# Patient Record
Sex: Male | Born: 1980 | Race: Black or African American | Hispanic: No | Marital: Married | Smoking: Former smoker
Health system: Southern US, Community
[De-identification: ages and names within clinical notes are randomized; demographics above are authoritative.]

## PROBLEM LIST (undated history)

## (undated) DIAGNOSIS — F419 Anxiety disorder, unspecified: Secondary | ICD-10-CM

## (undated) DIAGNOSIS — S39012A Strain of muscle, fascia and tendon of lower back, initial encounter: Secondary | ICD-10-CM

## (undated) DIAGNOSIS — J45909 Unspecified asthma, uncomplicated: Secondary | ICD-10-CM

## (undated) HISTORY — DX: Unspecified asthma, uncomplicated: J45.909

## (undated) HISTORY — PX: OTHER SURGICAL HISTORY: SHX169

## (undated) HISTORY — DX: Anxiety disorder, unspecified: F41.9

## (undated) HISTORY — DX: Strain of muscle, fascia and tendon of lower back, initial encounter: S39.012A

---
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
## 2003-05-11 DIAGNOSIS — S39012A Strain of muscle, fascia and tendon of lower back, initial encounter: Secondary | ICD-10-CM

## 2003-05-11 HISTORY — DX: Strain of muscle, fascia and tendon of lower back, initial encounter: S39.012A

## 2010-04-10 ENCOUNTER — Emergency Department (HOSPITAL_COMMUNITY)
Admission: EM | Admit: 2010-04-10 | Discharge: 2010-04-10 | Payer: Self-pay | Source: Home / Self Care | Admitting: Emergency Medicine

## 2011-05-07 IMAGING — CR DG CHEST 2V
2 series · 2 of 2 positions shown · non-contrast
Comparison: None

CLINICAL DATA: Dyspnea.  Chest pain.

CHEST - 2 VIEW

[w chest pa]
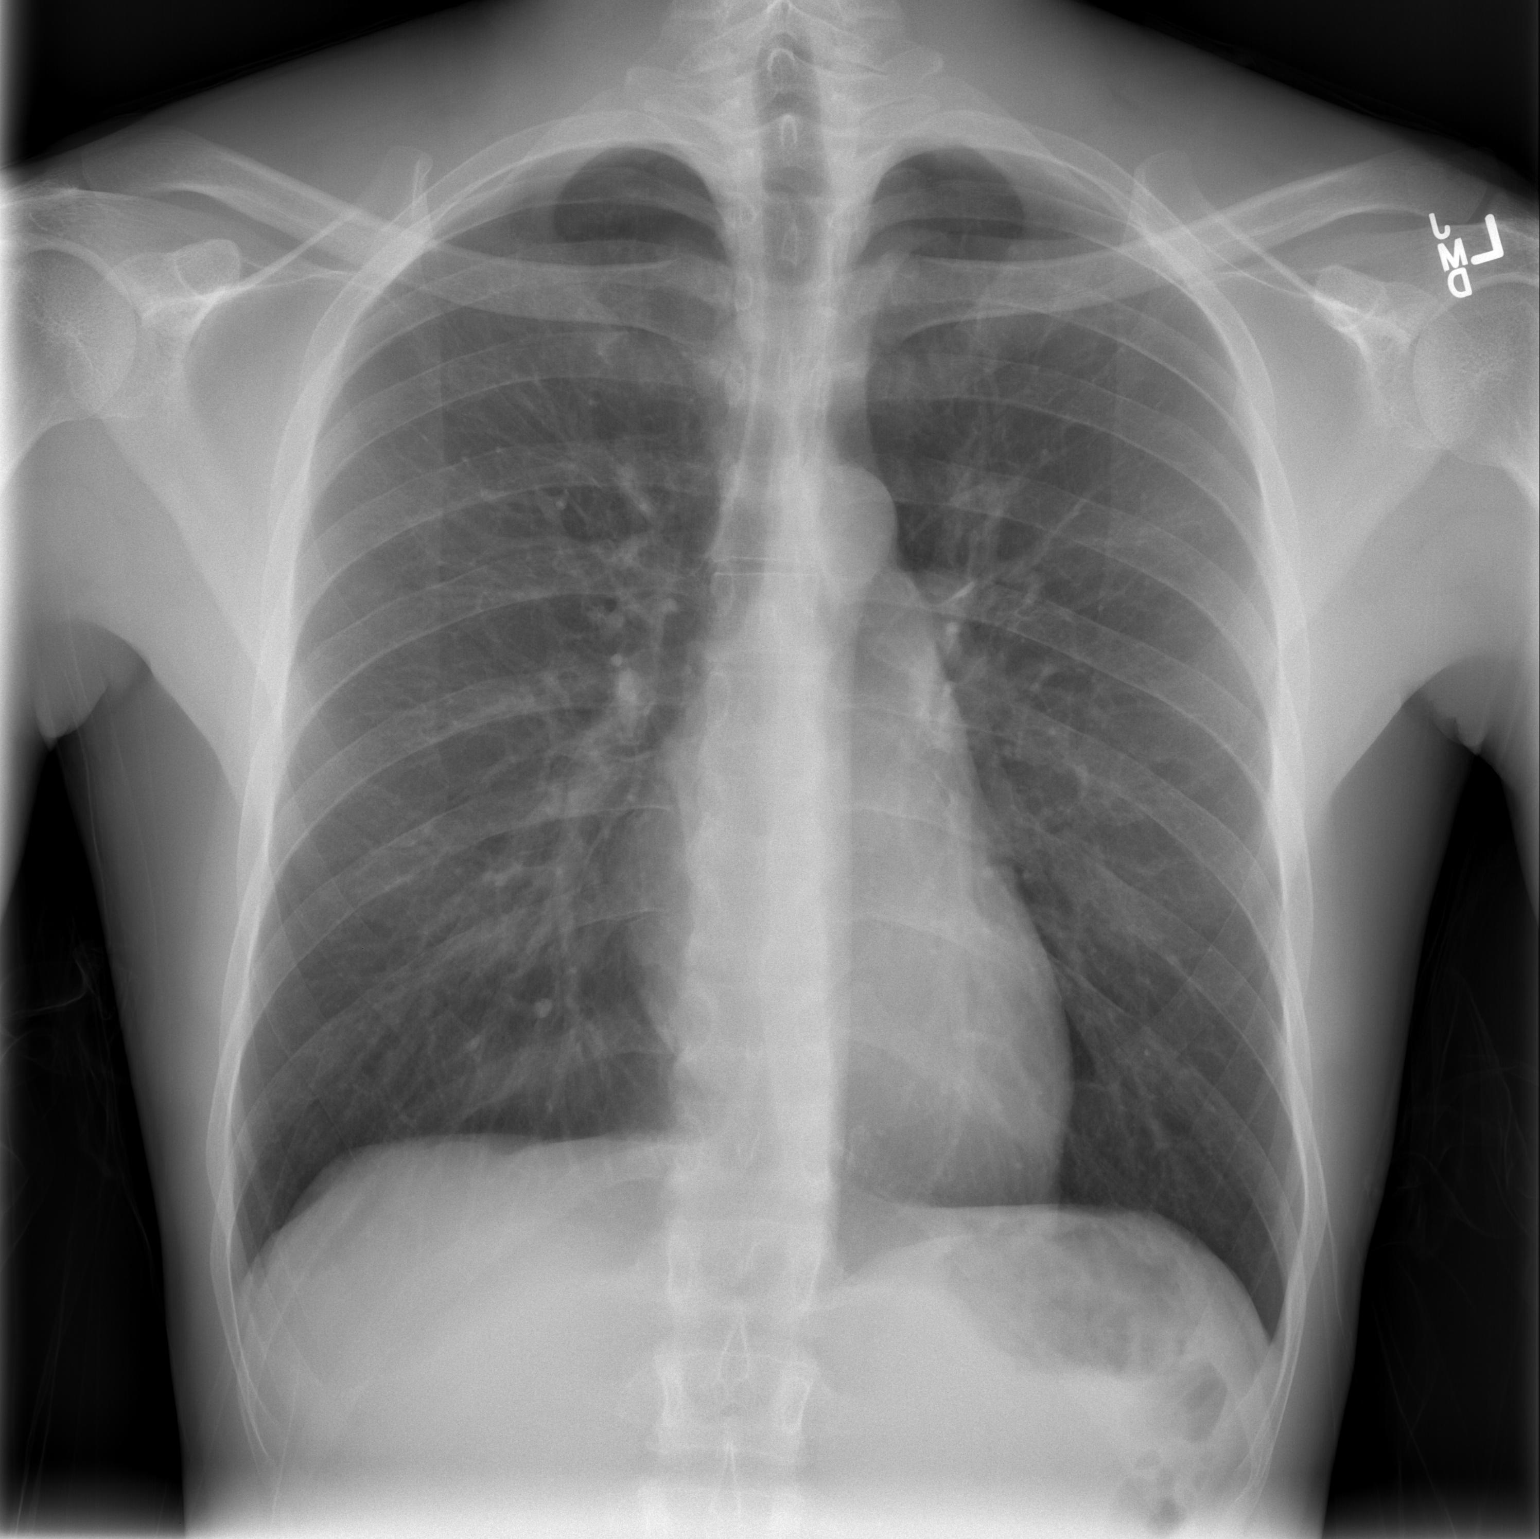

[w chest lat]
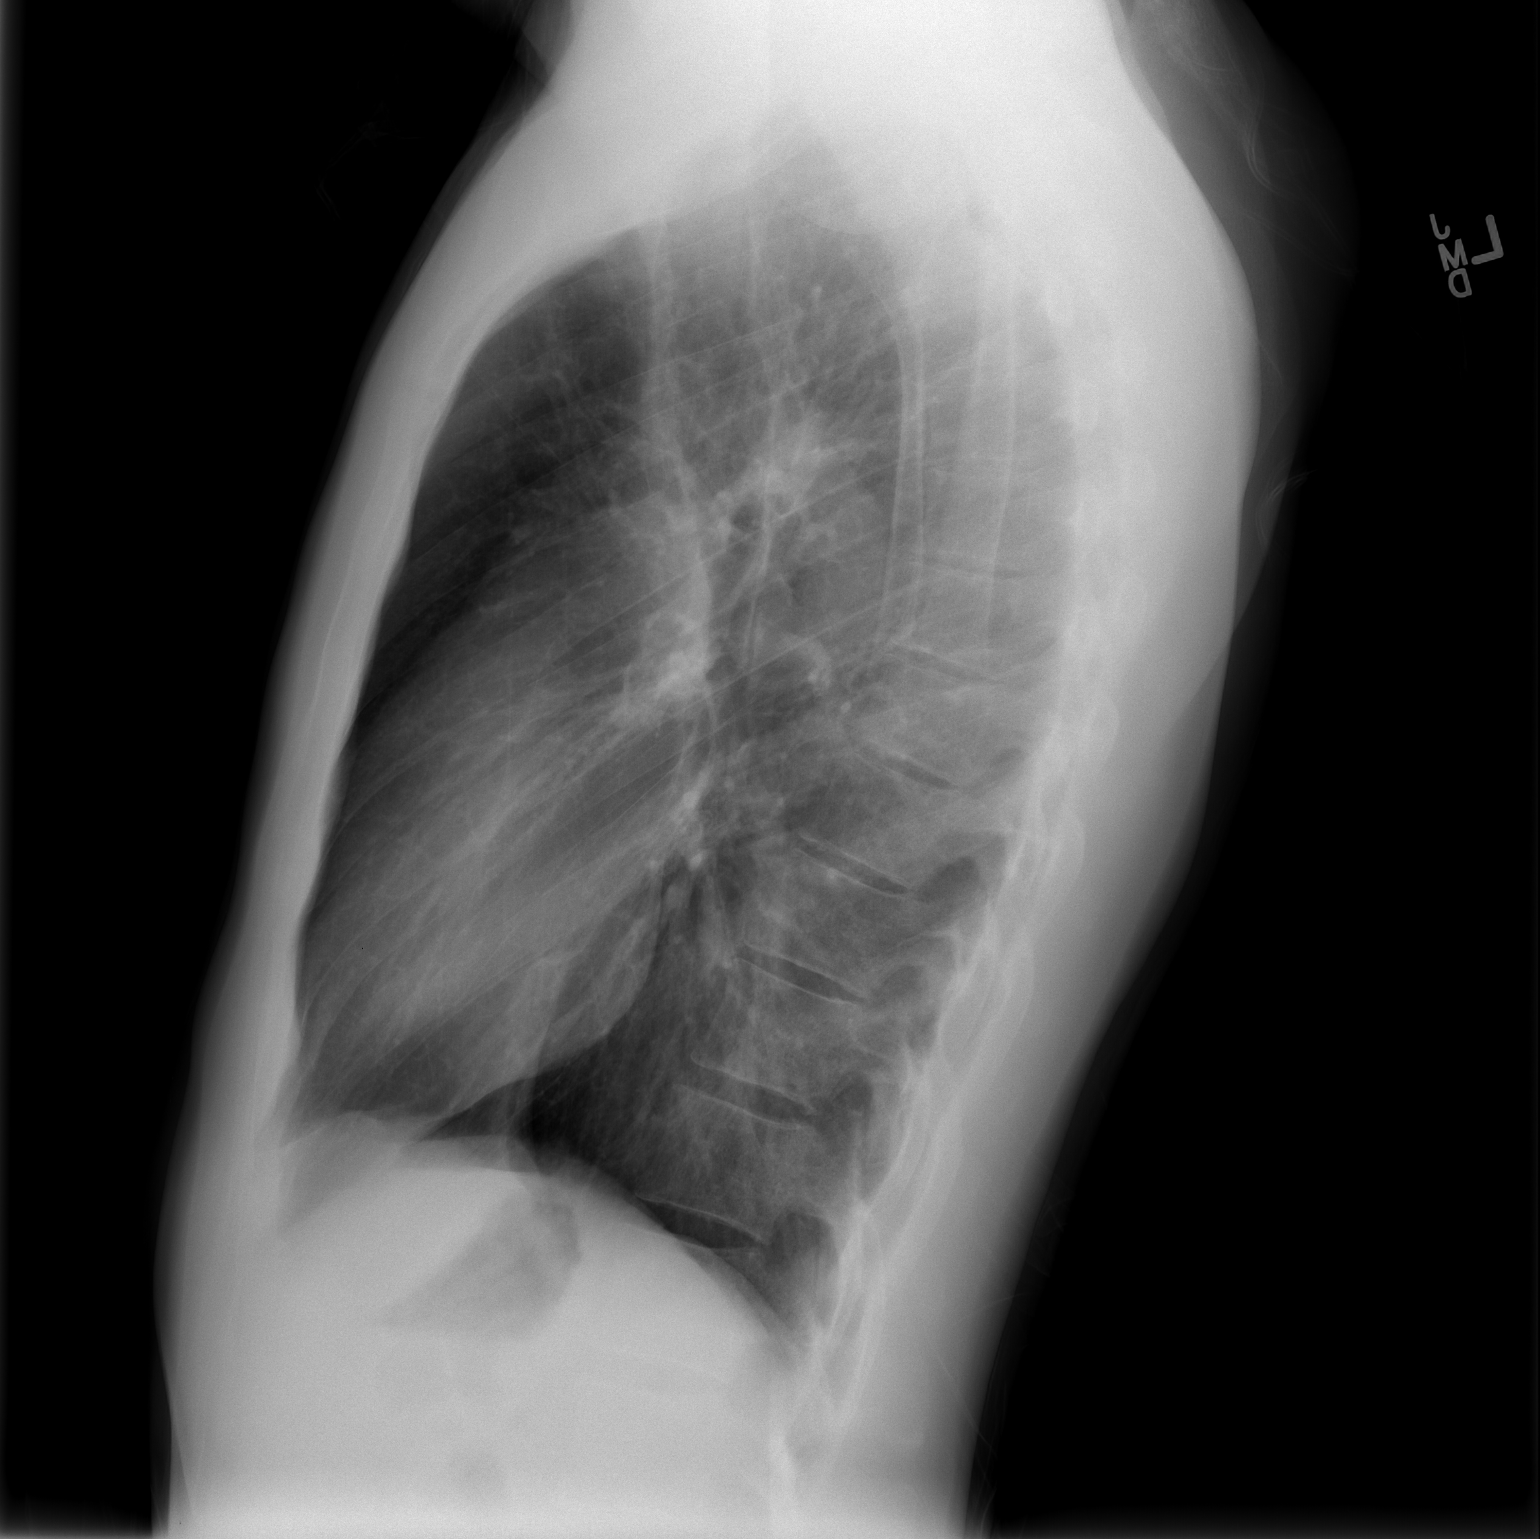

[2 of 2 positions shown; findings below may reference images not displayed]

FINDINGS: Cardiomediastinal silhouette is within normal limits.
The lungs are free of focal consolidations and pleural effusions.
Bony structures have a normal appearance.
IMPRESSION: Negative exam.

## 2013-03-26 ENCOUNTER — Ambulatory Visit: Payer: BC Managed Care – PPO

## 2013-03-27 ENCOUNTER — Emergency Department (HOSPITAL_COMMUNITY)
Admission: EM | Admit: 2013-03-27 | Discharge: 2013-03-27 | Disposition: A | Discharge status: Home / Self Care | Payer: BC Managed Care – PPO | Attending: Emergency Medicine | Admitting: Emergency Medicine

## 2013-03-27 DIAGNOSIS — M545 Low back pain, unspecified: Secondary | ICD-10-CM | POA: Insufficient documentation

## 2013-03-27 DIAGNOSIS — G8911 Acute pain due to trauma: Secondary | ICD-10-CM | POA: Insufficient documentation

## 2013-03-27 DIAGNOSIS — M79609 Pain in unspecified limb: Secondary | ICD-10-CM | POA: Insufficient documentation

## 2013-03-27 MED ORDER — IBUPROFEN 800 MG PO TABS
800.0000 mg | ORAL_TABLET | Freq: Once | ORAL | Status: AC
  Administered 2013-03-27: 800 mg via ORAL
  Filled 2013-03-27: qty 1

## 2013-03-27 MED ORDER — PREDNISONE 20 MG PO TABS
60.0000 mg | ORAL_TABLET | Freq: Once | ORAL | Status: AC
  Administered 2013-03-27: 60 mg via ORAL
  Filled 2013-03-27: qty 3

## 2013-03-27 MED ORDER — IBUPROFEN 800 MG PO TABS: 800.0000 mg | ORAL_TABLET | Freq: Three times a day (TID) | ORAL | Status: DC

## 2013-03-27 MED ORDER — PREDNISONE 10 MG PO TABS: 60.0000 mg | ORAL_TABLET | Freq: Every day | ORAL | Status: DC

## 2013-03-27 NOTE — ED Provider Notes (Signed)
CSN: [id number]     Arrival date & time 03/27/13  4098 History   First MD Initiated Contact with Patient 03/27/13 [phone]     No chief complaint on file.  (Consider location/radiation/quality/duration/timing/severity/associated sxs/prior Treatment) HPI Comments: The patient is a 32 year old male with history of trama presents to the ED with lower back pain and occasional pain in his right leg. He reports after the initial injury, 2 years ago, he had a full evaluation including imaging.  He reports several episodes of increased pain with movement over the past two years.  Most recent episode occurred 2 days ago.  He denies fever or chills, numbness, bowl incontinence, saddle paraesthesia, headache, or urinary retention. No testicular pain, swelling, dysurai or penile discharge.  No PCP at this time.  Patient is a 32 y.o. male presenting with back pain. The history is provided by the patient.  Back Pain Location:  Lumbar spine Quality:  Stiffness Stiffness is present:  All day Radiates to:  R thigh Pain severity:  Moderate Onset quality:  Gradual Duration:  2 days Timing:  Intermittent Progression:  Unchanged Chronicity:  Chronic Context: lifting heavy objects and physical stress   Context: not jumping from heights   Relieved by:  Being still Worsened by:  Bending and movement Ineffective treatments:  NSAIDs Associated symptoms: leg pain   Associated symptoms: no abdominal pain, no bladder incontinence, no bowel incontinence, no dysuria, no fever, no headaches, no numbness, no paresthesias, no perianal numbness, no tingling and no weakness   Risk factors comment:  History of trauma 2 years ago   No past medical history on file. No past surgical history on file. No family history on file. History  Substance Use Topics  . Smoking status: Not on file  . Smokeless tobacco: Not on file  . Alcohol Use: Not on file    Review of Systems  Constitutional: Negative for fever.    Gastrointestinal: Negative for abdominal pain and bowel incontinence.  Genitourinary: Negative for bladder incontinence and dysuria.  Musculoskeletal: Positive for back pain.  Neurological: Negative for tingling, weakness, numbness, headaches and paresthesias.  All other systems reviewed and are negative.    Allergies  Review of patient's allergies indicates not on file.  Home Medications  No current outpatient prescriptions on file. There were no vitals taken for this visit. Physical Exam  Nursing note and vitals reviewed. Constitutional: He is oriented to person, place, and time. He appears well-developed and well-nourished. No distress.  HENT:  Head: Normocephalic and atraumatic.  Eyes: EOM are normal. Pupils are equal, round, and reactive to light. No scleral icterus.  Neck: Neck supple.  Cardiovascular: Normal rate, regular rhythm and normal heart sounds.   No murmur heard. Pulmonary/Chest: Effort normal and breath sounds normal. He has no wheezes.  Abdominal: Soft. Normal appearance and bowel sounds are normal. There is no tenderness. There is no rebound and no guarding.  Musculoskeletal: Normal range of motion.       Lumbar back: He exhibits tenderness. He exhibits normal range of motion, no edema, no deformity, no pain and no spasm.       Back:  SI joints tender to palpation. Right greater than Left.  Neurological: He is alert and oriented to person, place, and time.  Skin: Skin is warm and dry. No rash noted.  Psychiatric: He has a normal mood and affect.    ED Course  Procedures (including critical care time) Labs Review Labs Reviewed - No data to  display Imaging Review No results found.  EKG Interpretation   None       MDM   1. 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 Discussed lab results, imaging results, and treatment plan with the patient.  She reports understanding and  no other concerns at this time.  He has pain at the [facility] joint R>L. Will start him on pain medications and steroids to reduce inflammation.  Patient is stable for discharge at this time.  Meds given in ED:  Medications  ibuprofen (ADVIL,MOTRIN) tablet 800 mg (800 mg Oral Given 03/27/13 1003)  predniSONE (DELTASONE) tablet 60 mg (60 mg Oral Given 03/27/13 1004)    Discharge Medication List as of 03/27/2013  8:56 AM    START taking these medications   Details  ibuprofen (ADVIL,MOTRIN) 800 MG tablet Take 1 tablet (800 mg total) by mouth 3 (three) times daily., Starting 03/27/2013, Until Discontinued, Print    predniSONE (DELTASONE) 10 MG tablet Take 6 tablets (60 mg total) by mouth daily., Starting 03/27/2013, Until Discontinued, Print           Clabe Seal, PA-C 03/29/13 2104

## 2013-03-27 NOTE — Progress Notes (Signed)
[facility] provided pt with a list of primary care resources. Patient stated was in the process of getting back insurance.

## 2013-03-30 NOTE — ED Provider Notes (Signed)
Medical screening examination/treatment/procedure(s) were performed by non-physician practitioner and as supervising physician I was immediately available for consultation/collaboration.  EKG Interpretation   None      Gerrie Castiglia, MD, FACEP   Kaysie Michelini L Kema Santaella, MD 03/30/13 1459 

## 2014-02-24 ENCOUNTER — Ambulatory Visit (INDEPENDENT_AMBULATORY_CARE_PROVIDER_SITE_OTHER): Payer: BC Managed Care – PPO | Admitting: Family Medicine

## 2014-02-24 VITALS — BP 120/84 | HR 68 | Temp 98.3°F | Resp 16 | Ht 73.5 in | Wt 161.0 lb

## 2014-02-24 DIAGNOSIS — M545 Low back pain: Secondary | ICD-10-CM

## 2014-02-24 DIAGNOSIS — M62830 Muscle spasm of back: Secondary | ICD-10-CM

## 2014-02-24 MED ORDER — CYCLOBENZAPRINE HCL 10 MG PO TABS: 10.0000 mg | ORAL_TABLET | Freq: Three times a day (TID) | ORAL | Status: DC | PRN

## 2014-02-24 NOTE — Patient Instructions (Addendum)
Please perform the 3 hamstring and buttock stretches we discussed 5 reps for each side, twice daily for 1 week. Then continue stretching exercises once a day as needed.   Low Back Sprain with Rehab  A sprain is an injury in which a ligament is torn. The ligaments of the lower back are vulnerable to sprains. However, they are strong and require great force to be injured. These ligaments are important for stabilizing the spinal column. Sprains are classified into three categories. Grade 1 sprains cause pain, but the tendon is not lengthened. Grade 2 sprains include a lengthened ligament, due to the ligament being stretched or partially ruptured. With grade 2 sprains there is still function, although the function may be decreased. Grade 3 sprains involve a complete tear of the tendon or muscle, and function is usually impaired. SYMPTOMS   Severe pain in the lower back.  Sometimes, a feeling of a "pop," "snap," or tear, at the time of injury.  Tenderness and sometimes swelling at the injury site.  Uncommonly, bruising (contusion) within 48 hours of injury.  Muscle spasms in the back. CAUSES  Low back sprains occur when a force is placed on the ligaments that is greater than they can handle. Common causes of injury include:  Performing a stressful act while off-balance.  Repetitive stressful activities that involve movement of the lower back.  Direct hit (trauma) to the lower back. RISK INCREASES WITH:  Contact sports (football, wrestling).  Collisions (major skiing accidents).  Sports that require throwing or lifting (baseball, weightlifting).  Sports involving twisting of the spine (gymnastics, diving, tennis, golf).  Poor strength and flexibility.  Inadequate protection.  Previous back injury or surgery (especially fusion). PREVENTION  Wear properly fitted and padded protective equipment.  Warm up and stretch properly before activity.  Allow for adequate recovery between  workouts.  Maintain physical fitness:  Strength, flexibility, and endurance.  Cardiovascular fitness.  Maintain a healthy body weight. PROGNOSIS  If treated properly, low back sprains usually heal with non-surgical treatment. The length of time for healing depends on the severity of the injury.  RELATED COMPLICATIONS   Recurring symptoms, resulting in a chronic problem.  Chronic inflammation and pain in the low back.  Delayed healing or resolution of symptoms, especially if activity is resumed too soon.  Prolonged impairment.  Unstable or arthritic joints of the low back. TREATMENT  Treatment first involves the use of ice and medicine, to reduce pain and inflammation. The use of strengthening and stretching exercises may help reduce pain with activity. These exercises may be performed at home or with a therapist. Severe injuries may require referral to a therapist for further evaluation and treatment, such as ultrasound. Your caregiver may advise that you wear a back brace or corset, to help reduce pain and discomfort. Often, prolonged bed rest results in greater harm then benefit. Corticosteroid injections may be recommended. However, these should be reserved for the most serious cases. It is important to avoid using your back when lifting objects. At night, sleep on your back on a firm mattress, with a pillow placed under your knees. If non-surgical treatment is unsuccessful, surgery may be needed.  MEDICATION   If pain medicine is needed, nonsteroidal anti-inflammatory medicines (aspirin and ibuprofen), or other minor pain relievers (acetaminophen), are often advised.  Do not take pain medicine for 7 days before surgery.  Prescription pain relievers may be given, if your caregiver thinks they are needed. Use only as directed and only as much  as you need.  Ointments applied to the skin may be helpful.  Corticosteroid injections may be given by your caregiver. These injections  should be reserved for the most serious cases, because they may only be given a certain number of times. HEAT AND COLD  Cold treatment (icing) should be applied for 10 to 15 minutes every 2 to 3 hours for inflammation and pain, and immediately after activity that aggravates your symptoms. Use ice packs or an ice massage.  Heat treatment may be used before performing stretching and strengthening activities prescribed by your caregiver, physical therapist, or athletic trainer. Use a heat pack or a warm water soak. SEEK MEDICAL CARE IF:   Symptoms get worse or do not improve in 2 to 4 weeks, despite treatment.  You develop numbness or weakness in either leg.  You lose bowel or bladder function.  Any of the following occur after surgery: fever, increased pain, swelling, redness, drainage of fluids, or bleeding in the affected area.  New, unexplained symptoms develop. (Drugs used in treatment may produce side effects.) EXERCISES  RANGE OF MOTION (ROM) AND STRETCHING EXERCISES - Low Back Sprain Most people with lower back pain will find that their symptoms get worse with excessive bending forward (flexion) or arching at the lower back (extension). The exercises that will help resolve your symptoms will focus on the opposite motion.  Your physician, physical therapist or athletic trainer will help you determine which exercises will be most helpful to resolve your lower back pain. Do not complete any exercises without first consulting with your caregiver. Discontinue any exercises which make your symptoms worse, until you speak to your caregiver. If you have pain, numbness or tingling which travels down into your buttocks, leg or foot, the goal of the therapy is for these symptoms to move closer to your back and eventually resolve. Sometimes, these leg symptoms will get better, but your lower back pain may worsen. This is often an indication of progress in your rehabilitation. Be very alert to any  changes in your symptoms and the activities in which you participated in the 24 hours prior to the change. Sharing this information with your caregiver will allow him or her to most efficiently treat your condition. These exercises may help you when beginning to rehabilitate your injury. Your symptoms may resolve with or without further involvement from your physician, physical therapist or athletic trainer. While completing these exercises, remember:   Restoring tissue flexibility helps normal motion to return to the joints. This allows healthier, less painful movement and activity.  An effective stretch should be held for at least 30 seconds.  A stretch should never be painful. You should only feel a gentle lengthening or release in the stretched tissue. FLEXION RANGE OF MOTION AND STRETCHING EXERCISES: STRETCH - Flexion, Single Knee to Chest   Lie on a firm bed or floor with both legs extended in front of you.  Keeping one leg in contact with the floor, bring your opposite knee to your chest. Hold your leg in place by either grabbing behind your thigh or at your knee.  Pull until you feel a gentle stretch in your low back. Hold __________ seconds.  Slowly release your grasp and repeat the exercise with the opposite side. Repeat __________ times. Complete this exercise __________ times per day.  STRETCH - Flexion, Double Knee to Chest  Lie on a firm bed or floor with both legs extended in front of you.  Keeping one leg in contact  with the floor, bring your opposite knee to your chest.  Tense your stomach muscles to support your back and then lift your other knee to your chest. Hold your legs in place by either grabbing behind your thighs or at your knees.  Pull both knees toward your chest until you feel a gentle stretch in your low back. Hold __________ seconds.  Tense your stomach muscles and slowly return one leg at a time to the floor. Repeat __________ times. Complete this  exercise __________ times per day.  STRETCH - Low Trunk Rotation  Lie on a firm bed or floor. Keeping your legs in front of you, bend your knees so they are both pointed toward the ceiling and your feet are flat on the floor.  Extend your arms out to the side. This will stabilize your upper body by keeping your shoulders in contact with the floor.  Gently and slowly drop both knees together to one side until you feel a gentle stretch in your low back. Hold for __________ seconds.  Tense your stomach muscles to support your lower back as you bring your knees back to the starting position. Repeat the exercise to the other side. Repeat __________ times. Complete this exercise __________ times per day  EXTENSION RANGE OF MOTION AND FLEXIBILITY EXERCISES: STRETCH - Extension, Prone on Elbows   Lie on your stomach on the floor, a bed will be too soft. Place your palms about shoulder width apart and at the height of your head.  Place your elbows under your shoulders. If this is too painful, stack pillows under your chest.  Allow your body to relax so that your hips drop lower and make contact more completely with the floor.  Hold this position for __________ seconds.  Slowly return to lying flat on the floor. Repeat __________ times. Complete this exercise __________ times per day.  RANGE OF MOTION - Extension, Prone Press Ups  Lie on your stomach on the floor, a bed will be too soft. Place your palms about shoulder width apart and at the height of your head.  Keeping your back as relaxed as possible, slowly straighten your elbows while keeping your hips on the floor. You may adjust the placement of your hands to maximize your comfort. As you gain motion, your hands will come more underneath your shoulders.  Hold this position __________ seconds.  Slowly return to lying flat on the floor. Repeat __________ times. Complete this exercise __________ times per day.  RANGE OF MOTION- Quadruped,  Neutral Spine   Assume a hands and knees position on a firm surface. Keep your hands under your shoulders and your knees under your hips. You may place padding under your knees for comfort.  Drop your head and point your tailbone toward the ground below you. This will round out your lower back like an angry cat. Hold this position for __________ seconds.  Slowly lift your head and release your tail bone so that your back sags into a large arch, like an old horse.  Hold this position for __________ seconds.  Repeat this until you feel limber in your low back.  Now, find your "sweet spot." This will be the most comfortable position somewhere between the two previous positions. This is your neutral spine. Once you have found this position, tense your stomach muscles to support your low back.  Hold this position for __________ seconds. Repeat __________ times. Complete this exercise __________ times per day.  STRENGTHENING EXERCISES - Low Back  Sprain These exercises may help you when beginning to rehabilitate your injury. These exercises should be done near your "sweet spot." This is the neutral, low-back arch, somewhere between fully rounded and fully arched, that is your least painful position. When performed in this safe range of motion, these exercises can be used for people who have either a flexion or extension based injury. These exercises may resolve your symptoms with or without further involvement from your physician, physical therapist or athletic trainer. While completing these exercises, remember:   Muscles can gain both the endurance and the strength needed for everyday activities through controlled exercises.  Complete these exercises as instructed by your physician, physical therapist or athletic trainer. Increase the resistance and repetitions only as guided.  You may experience muscle soreness or fatigue, but the pain or discomfort you are trying to eliminate should never worsen  during these exercises. If this pain does worsen, stop and make certain you are following the directions exactly. If the pain is still present after adjustments, discontinue the exercise until you can discuss the trouble with your caregiver. STRENGTHENING - Deep Abdominals, Pelvic Tilt   Lie on a firm bed or floor. Keeping your legs in front of you, bend your knees so they are both pointed toward the ceiling and your feet are flat on the floor.  Tense your lower abdominal muscles to press your low back into the floor. This motion will rotate your pelvis so that your tail bone is scooping upwards rather than pointing at your feet or into the floor. With a gentle tension and even breathing, hold this position for __________ seconds. Repeat __________ times. Complete this exercise __________ times per day.  STRENGTHENING - Abdominals, Crunches   Lie on a firm bed or floor. Keeping your legs in front of you, bend your knees so they are both pointed toward the ceiling and your feet are flat on the floor. Cross your arms over your chest.  Slightly tip your chin down without bending your neck.  Tense your abdominals and slowly lift your trunk high enough to just clear your shoulder blades. Lifting higher can put excessive stress on the lower back and does not further strengthen your abdominal muscles.  Control your return to the starting position. Repeat __________ times. Complete this exercise __________ times per day.  STRENGTHENING - Quadruped, Opposite UE/LE Lift   Assume a hands and knees position on a firm surface. Keep your hands under your shoulders and your knees under your hips. You may place padding under your knees for comfort.  Find your neutral spine and gently tense your abdominal muscles so that you can maintain this position. Your shoulders and hips should form a rectangle that is parallel with the floor and is not twisted.  Keeping your trunk steady, lift your right hand no higher  than your shoulder and then your left leg no higher than your hip. Make sure you are not holding your breath. Hold this position for __________ seconds.  Continuing to keep your abdominal muscles tense and your back steady, slowly return to your starting position. Repeat with the opposite arm and leg. Repeat __________ times. Complete this exercise __________ times per day.  STRENGTHENING - Abdominals and Quadriceps, Straight Leg Raise   Lie on a firm bed or floor with both legs extended in front of you.  Keeping one leg in contact with the floor, bend the other knee so that your foot can rest flat on the floor.  Find  your neutral spine, and tense your abdominal muscles to maintain your spinal position throughout the exercise.  Slowly lift your straight leg off the floor about 6 inches for a count of 15, making sure to not hold your breath.  Still keeping your neutral spine, slowly lower your leg all the way to the floor. Repeat this exercise with each leg __________ times. Complete this exercise __________ times per day. POSTURE AND BODY MECHANICS CONSIDERATIONS - Low Back Sprain Keeping correct posture when sitting, standing or completing your activities will reduce the stress put on different body tissues, allowing injured tissues a chance to heal and limiting painful experiences. The following are general guidelines for improved posture. Your physician or physical therapist will provide you with any instructions specific to your needs. While reading these guidelines, remember:  The exercises prescribed by your provider will help you have the flexibility and strength to maintain correct postures.  The correct posture provides the best environment for your joints to work. All of your joints have less wear and tear when properly supported by a spine with good posture. This means you will experience a healthier, less painful body.  Correct posture must be practiced with all of your activities,  especially prolonged sitting and standing. Correct posture is as important when doing repetitive low-stress activities (typing) as it is when doing a single heavy-load activity (lifting). RESTING POSITIONS Consider which positions are most painful for you when choosing a resting position. If you have pain with flexion-based activities (sitting, bending, stooping, squatting), choose a position that allows you to rest in a less flexed posture. You would want to avoid curling into a fetal position on your side. If your pain worsens with extension-based activities (prolonged standing, working overhead), avoid resting in an extended position such as sleeping on your stomach. Most people will find more comfort when they rest with their spine in a more neutral position, neither too rounded nor too arched. Lying on a non-sagging bed on your side with a pillow between your knees, or on your back with a pillow under your knees will often provide some relief. Keep in mind, being in any one position for a prolonged period of time, no matter how correct your posture, can still lead to stiffness. PROPER SITTING POSTURE In order to minimize stress and discomfort on your spine, you must sit with correct posture. Sitting with good posture should be effortless for a healthy body. Returning to good posture is a gradual process. Many people can work toward this most comfortably by using various supports until they have the flexibility and strength to maintain this posture on their own. When sitting with proper posture, your ears will fall over your shoulders and your shoulders will fall over your hips. You should use the back of the chair to support your upper back. Your lower back will be in a neutral position, just slightly arched. You may place a small pillow or folded towel at the base of your lower back for  support.  When working at a desk, create an environment that supports good, upright posture. Without extra support,  muscles tire, which leads to excessive strain on joints and other tissues. Keep these recommendations in mind: CHAIR:  A chair should be able to slide under your desk when your back makes contact with the back of the chair. This allows you to work closely.  The chair's height should allow your eyes to be level with the upper part of your monitor and your  hands to be slightly lower than your elbows. BODY POSITION  Your feet should make contact with the floor. If this is not possible, use a foot rest.  Keep your ears over your shoulders. This will reduce stress on your neck and low back. INCORRECT SITTING POSTURES  If you are feeling tired and unable to assume a healthy sitting posture, do not slouch or slump. This puts excessive strain on your back tissues, causing more damage and pain. Healthier options include:  Using more support, like a lumbar pillow.  Switching tasks to something that requires you to be upright or walking.  Talking a brief walk.  Lying down to rest in a neutral-spine position. PROLONGED STANDING WHILE SLIGHTLY LEANING FORWARD  When completing a task that requires you to lean forward while standing in one place for a long time, place either foot up on a stationary 2-4 inch high object to help maintain the best posture. When both feet are on the ground, the lower back tends to lose its slight inward curve. If this curve flattens (or becomes too large), then the back and your other joints will experience too much stress, tire more quickly, and can cause pain. CORRECT STANDING POSTURES Proper standing posture should be assumed with all daily activities, even if they only take a few moments, like when brushing your teeth. As in sitting, your ears should fall over your shoulders and your shoulders should fall over your hips. You should keep a slight tension in your abdominal muscles to brace your spine. Your tailbone should point down to the ground, not behind your body,  resulting in an over-extended swayback posture.  INCORRECT STANDING POSTURES  Common incorrect standing postures include a forward head, locked knees and/or an excessive swayback. WALKING Walk with an upright posture. Your ears, shoulders and hips should all line-up. PROLONGED ACTIVITY IN A FLEXED POSITION When completing a task that requires you to bend forward at your waist or lean over a low surface, try to find a way to stabilize 3 out of 4 of your limbs. You can place a hand or elbow on your thigh or rest a knee on the surface you are reaching across. This will provide you more stability, so that your muscles do not tire as quickly. By keeping your knees relaxed, or slightly bent, you will also reduce stress across your lower back. CORRECT LIFTING TECHNIQUES DO :  Assume a wide stance. This will provide you more stability and the opportunity to get as close as possible to the object which you are lifting.  Tense your abdominals to brace your spine. Bend at the knees and hips. Keeping your back locked in a neutral-spine position, lift using your leg muscles. Lift with your legs, keeping your back straight.  Test the weight of unknown objects before attempting to lift them.  Try to keep your elbows locked down at your sides in order get the best strength from your shoulders when carrying an object.  Always ask for help when lifting heavy or awkward objects. INCORRECT LIFTING TECHNIQUES DO NOT:   Lock your knees when lifting, even if it is a small object.  Bend and twist. Pivot at your feet or move your feet when needing to change directions.  Assume that you can safely pick up even a paperclip without proper posture. Document Released: 04/26/2005 Document Revised: 07/19/2011 Document Reviewed: 08/08/2008 [facility] Patient Information 2015 [place], [place]. This information is not intended to replace advice given to you by your health  care provider. Make sure you discuss any questions you  have with your health care provider.

## 2014-02-24 NOTE — Progress Notes (Signed)
   Subjective:    Patient ID: Stephen Sharp, male    DOB: 08/06/1980, 33 y.o.   MRN: [id number]  HPI  Stephen Sharp is a 33 y.o. male presenting as a new patient for recurrent low back pain. Patient states that his chronic back pain started years ago when he was working at a [employer], tried to lift a heavy trash can and had immediate back pain. He was evaluated through worker's comp, referred to orthopedist. Patient had full work up but did not receive any particular medications, completed physical therapy. Patient has had recurrences of back pain since then, flares occur randomly, states it affects his posture and the way he walks. Pain is described as dull, aching pain, not sharp, no radiation or shooting pains. He takes ibuprofen 800mg  for the pain with some relief. In the past, he has also been prescribed Prednisone and felt moderate relief. Patient states that he does not smoke, drinks a six pack of beers throughout the week, denies IV drug use. Also denies radiculopathy, fevers, chills, numbness, tingling, loss of bowel and bladder function, rashes. Of note, patient currently works as a Investment banker, operationalchef at [employer] and is required to do heavy lifting. No other aggravating or relieving factors.  No current medications.  No known drug allergies.  Past Medical History  Diagnosis Date  . Anxiety   . Asthma   . Lumbar strain 2005   Past Surgical History  Procedure Laterality Date  . Other surgical history      submandibular cyst removal  . Cyst excision  2002    under chin    Review of Systems As in subjective.    Objective:   Physical Exam  Vitals reviewed. Constitutional: He is oriented to person, place, and time. He appears well-developed and well-nourished. No distress.  BP 120/84  Pulse 68  Temp(Src) 98.3 F (36.8 C) (Oral)  Resp 16  Ht 6' 1.5" (1.867 m)  Wt 161 lb (73.029 kg)  BMI 20.95 kg/m2  SpO2 100%   Abdominal: Soft. Bowel sounds are normal. He exhibits no mass.  There is no tenderness.  Musculoskeletal:       Lumbar back: He exhibits decreased range of motion (Flexion and extension, NORMAL rotation and lateral flexion), tenderness (winces, grunts with movement) and spasm (severe lumbar region). He exhibits no bony tenderness, no swelling, no edema and no deformity.  Neurological: He is alert and oriented to person, place, and time. He has normal reflexes.  Skin: Skin is warm and dry. No rash noted. He is not diaphoretic. No erythema.      Assessment & Plan:   1. Spasm of back muscles 2. Low back pain without sciatica, unspecified back pain laterality - Recommend low back stretches and exercise, modification of ADL's and continued ergonomics with work, Rx flexeril for spasms, Alleve for back pain, follow up if symptoms worsen, fail to resolve or as needed. - cyclobenzaprine (FLEXERIL) 10 MG tablet; Take 1 tablet (10 mg total) by mouth 3 (three) times daily as needed for muscle spasms. Take 1 tablet by mouth 3 times daily for 3 days, then switch to 1 tablet daily at night.  Dispense: 30 tablet; Refill: 0  Wallis BambergMario Decorey Wahlert, PA-C [facility] [phone] 02/24/2014 5:59 PM

## 2014-02-25 NOTE — Progress Notes (Signed)
History and physical examinations obtained with Gurney MaxinMike Mani, PA-C.  Straight leg raises negative; toe and heel walking intact; marching intact. Motor 5/5 BLE.  Decreased ROM all directions of lumbar spine.  Agree with assessment and plan.

## 2015-07-05 ENCOUNTER — Ambulatory Visit: Payer: 59

## 2015-07-05 ENCOUNTER — Ambulatory Visit (INDEPENDENT_AMBULATORY_CARE_PROVIDER_SITE_OTHER): Payer: Worker's Compensation | Admitting: Urgent Care

## 2015-07-05 VITALS — BP 145/91 | HR 78 | Temp 98.8°F | Resp 18 | Ht 74.0 in | Wt 161.0 lb

## 2015-07-05 DIAGNOSIS — Z23 Encounter for immunization: Secondary | ICD-10-CM

## 2015-07-05 DIAGNOSIS — S61219A Laceration without foreign body of unspecified finger without damage to nail, initial encounter: Secondary | ICD-10-CM

## 2015-07-05 NOTE — Progress Notes (Signed)
    MRN: [id number] DOB: December 18, 1980  Subjective:   Stephen Sharp is a 35 y.o. 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 He came immediately to our clinic. He has since rinsed off his finger with soap and water, wrapped it in gauze. He cannot recall the last TDAP he received.  Chiron's medications list, allergies, past medical history and past surgical history were reviewed and excluded from this note due to being a worker's comp case.  Objective:   Vitals: BP 145/91 mmHg  Pulse 78  Temp(Src) 98.8 F (37.1 C) (Oral)  Resp 18  Ht  (1.88 m)  Wt 161 lb (73.029 kg)  BMI 20.66 kg/m2  SpO2 99%  Physical Exam  Constitutional: He is oriented to person, place, and time. He appears well-developed and well-nourished.  Cardiovascular: Normal rate.   Pulmonary/Chest: Effort normal.  Musculoskeletal:       Hands: Neurological: He is alert and oriented to person, place, and time.  Skin: Skin is warm and dry.   PROCEDURE NOTE: laceration repair Verbal consent obtained from patient. Laceration repair performed with PA-S Dani under my direct supervision. Local anesthesia with 5cc mixture of 0.5% Marcaine with 2% lidocaine without epinephrine.  Wound explored for tendon, ligament damage. Wound scrubbed with soap and water and rinsed. Wound closed with #5 4-0 Prolene simple interrupted sutures securing a foil graft.  Wound cleansed and dressed.  Assessment and Plan :   1. Finger laceration, initial encounter - Stable, counseled on wound care. Patient is to return to clinic in 10 days, work restrictions provided. RTC sooner if signs and symptoms of infection develop as discussed in clinic.  2. Need for Tdap vaccination - Tdap vaccine greater than or equal to 7yo IM   Wallis Bamberg, PA-C [facility] [phone] 07/05/2015 12:42 PM

## 2015-07-05 NOTE — Patient Instructions (Signed)

## 2015-07-15 ENCOUNTER — Ambulatory Visit (INDEPENDENT_AMBULATORY_CARE_PROVIDER_SITE_OTHER): Payer: Worker's Compensation | Admitting: Internal Medicine

## 2015-07-15 VITALS — BP 118/74 | HR 79 | Temp 98.0°F | Resp 16 | Ht 74.0 in | Wt 160.6 lb

## 2015-07-15 DIAGNOSIS — S61219D Laceration without foreign body of unspecified finger without damage to nail, subsequent encounter: Secondary | ICD-10-CM | POA: Diagnosis not present

## 2015-07-15 NOTE — Progress Notes (Signed)
   Subjective:  By signing my name below, I, Stephen Sharp, attest that this documentation has been prepared under the direction and in the presence of Stephen Siaobert Rateel Beldin, MD. Electronically Signed: Stann Oresung-Kai Sharp, Scribe. 07/15/2015 , 2:36 PM .  Patient was seen in Room 7 .   Patient ID: Stephen Sharp Sidener, male    DOB: 09/11/1980, 35 y.o.   MRN: [id number] Chief Complaint  Patient presents with  . Suture / Staple Removal    in his finger   HPI Stephen Sharp Stephen Sharp is a 35 y.o. male who presents to [facility] under workers comp for suture removal in his left 4th finger. His initial visit was on 07/05/15, seen by Wallis BambergMario Mani, PA-C. He also received a tdap at that time.   He states the area has healed. The metal in his finger does bother him though.   No Known Allergies  Review of Systems  Constitutional: Negative for fever, chills, activity change and fatigue.  Gastrointestinal: Negative for nausea, vomiting, abdominal pain, diarrhea and constipation.  Musculoskeletal: Negative for myalgias, joint swelling and arthralgias.  Skin: Negative for rash and wound.      Objective:   Physical Exam  Constitutional: He is oriented to person, place, and time. He appears well-developed and well-nourished. No distress.  HENT:  Head: Normocephalic and atraumatic.  Eyes: EOM are normal.  Musculoskeletal: Normal range of motion.  The wound on his finger is healing well without evidence of infection. The forearm graft is still intact. Sutures were removed from the forearm graft and the graft was left in place.  Neurological: He is alert and oriented to person, place, and time.  Skin: Skin is warm and dry.  Psychiatric: He has a normal mood and affect. His behavior is normal.  Nursing note and vitals reviewed.  BP 118/74 mmHg  Pulse 79  Temp(Src) 98 F (36.7 C) (Oral)  Resp 16  Ht 6\' 2"  (1.88 m)  Wt 160 lb 9.6 oz (72.848 kg)  BMI 20.61 kg/m2  SpO2 99%    Assessment & Plan:  I have completed the patient  encounter in its entirety as documented by the scribe, with editing by me where necessary. Deyja Sochacki P. Merla Richesoolittle, M.D.  Finger laceration, subsequent encounter  He will use a fold-over splint for protection at work and allow the full graft to fall off on its own No need for follow-up unless he has complications He has continued to work at full duty

## 2015-08-19 ENCOUNTER — Encounter (HOSPITAL_COMMUNITY): Payer: Self-pay | Admitting: Emergency Medicine

## 2015-08-19 ENCOUNTER — Emergency Department (HOSPITAL_COMMUNITY)
Admission: EM | Admit: 2015-08-19 | Discharge: 2015-08-19 | Disposition: A | Discharge status: Home / Self Care | Payer: 59 | Attending: Emergency Medicine | Admitting: Emergency Medicine

## 2015-08-19 DIAGNOSIS — J45909 Unspecified asthma, uncomplicated: Secondary | ICD-10-CM | POA: Diagnosis not present

## 2015-08-19 NOTE — ED Notes (Signed)
Pt called  No response from lobby  

## 2015-08-19 NOTE — ED Notes (Signed)
Writer called for room assignment, no response 

## 2015-08-19 NOTE — ED Notes (Signed)
Pt states he has asthma and has been feeling short of breath off and on for the past few days  Pt states he is out of his inhaler  Pt is in no acute resp distress at this time

## 2021-10-26 ENCOUNTER — Ambulatory Visit (HOSPITAL_COMMUNITY)
Admission: EM | Admit: 2021-10-26 | Discharge: 2021-10-26 | Disposition: A | Discharge status: Home / Self Care | Payer: No Payment, Other | Attending: Registered Nurse | Admitting: Registered Nurse

## 2021-10-26 ENCOUNTER — Encounter (HOSPITAL_COMMUNITY): Payer: Self-pay | Admitting: Registered Nurse

## 2021-10-26 DIAGNOSIS — F4323 Adjustment disorder with mixed anxiety and depressed mood: Secondary | ICD-10-CM | POA: Diagnosis present

## 2021-10-26 DIAGNOSIS — R45851 Suicidal ideations: Secondary | ICD-10-CM | POA: Insufficient documentation

## 2021-10-26 NOTE — ED Triage Notes (Signed)
Pt Stephen Sharp presents to [facility] accompanied by his mother due to worsening depression symptoms. Pt reports passive SI for the past week. Pt denies any plan or intent to harm himself. Pt reports stressors including; losing job, friend passed away, and recent DWI. Pt reports being diagnosed with anxiety/depression and being prescribed 30mg  of Lexapro. Pt states he is compliant with medication. Pt states his PCP prescribes this medication. Pt denies having a therapist or psychiatrist at the moment but would like to get set up with services. Pt states "I don't want to stay here I just want to get an evaluation". Pt states he is sleeping well and has a good appetite.Pt states he has moments where he just wants to give up but he does not want to take his life because he has his children to live for and they are his motivation to live. Pt reports 2 children age 66 and 27. Pt denies HI and AVH.

## 2021-10-26 NOTE — ED Provider Notes (Signed)
Behavioral Health Urgent Care Medical Screening Exam  Patient Name: Stephen Sharp MRN: [id number] Date of Evaluation: 10/26/21 Chief Complaint:   Diagnosis:  Final diagnoses:  Adjustment disorder with mixed anxiety and depressed mood    History of Present illness: Stephen Sharp is a 41 y.o. male patient presented to [facility] as a walk in accompanied by his mother with complaints of worsening depression and passive suicidal ideation  Stephen Sharp, 65 y.o., male patient seen face to face by this provider, consulted with Dr. Earlene Plater; and chart reviewed on 10/26/21.  On evaluation Stephen Sharp reports he came in today because he was brought by his mother after making a statement on Facebook "I just said something like I want to be with God.  I have had thoughts like just giving up or wanting to be with God but I would never try to kill myself .  I just couldn't do that.  I'm just feeling this way because I'm worrying about a lot of things that just came up and just feeling overwhelmed.  I'm worrying about my rent, finding another job, everything felt like it was just piling up."  Patient states there was never a plan or intent to kill himself.  Patient denies prior suicide attempt and self harm.  States he recently lost his job and got a DUI.  Patient states he lives alone but next door to his aunt and that his family is really supportive.  Patient states he is treated for depression by his primary care provider with Lexapro and that he is compliant with medication.  Denies prior psychiatric hospitalization or outpatient psychiatric services.  Patient states he is looking for outpatient psychiatric services and somewhere he can go to get a DUI assessment and classes before he has to go to court.  States this is his first "any kind of ticket or traffic violation."  Reports he was working at [employer] prior to losing job and states job loss was related to "to many call outs."   At this time patient denies suicidal/self-harm/homicidal ideation, psychosis, and paranoia.   During evaluation Stephen Sharp is sitting upright in chair with no noted distress.  He is alert/oriented x 4; calm/cooperative; and mood congruent with affect.  He is speaking in a clear tone at moderate volume, and normal pace; with good eye contact.  His thought process is coherent and relevant; There is no indication that he is currently responding to internal/external stimuli or experiencing delusional thought content; and he has denied suicidal/self-harm/homicidal ideation, psychosis, and paranoia.   Patient has remained calm throughout assessment and has answered questions appropriately.    At this time Stephen Sharp is educated and verbalizes understanding of mental health resources and other crisis services in the community. He is instructed to call 911 and present to the nearest emergency room should he experience any suicidal/homicidal ideation, auditory/visual/hallucinations, or detrimental worsening of his mental health condition.  He is given resource for community services, substance use services, and outpatient psychiatric services.      Psychiatric Specialty Exam  Presentation  General Appearance:Appropriate for Environment  Eye Contact:Good  Speech:Clear and Coherent; Normal Rate  Speech Volume:Normal  Handedness:Right   Mood and Affect  Mood:Dysphoric  Affect:Congruent   Thought Process  Thought Processes:Coherent; Goal Directed  Descriptions of Associations:Intact  Orientation:Full (Time, Place and Person)  Thought Content:Logical    Hallucinations:None  Ideas of Reference:None  Suicidal Thoughts:Yes, Passive Without Intent; Without Plan  Homicidal Thoughts:No   Sensorium  Memory:Immediate Good; Recent Good; Remote Good  Judgment:Intact  Insight:Present   Executive Functions  Concentration:Good  Attention Span:Good  Recall:Good  Fund of  Knowledge:Good  Language:Good   Psychomotor Activity  Psychomotor Activity:No data recorded  Assets  Assets:Communication Skills; Desire for Improvement; Housing; Leisure Time; Physical Health; Social Support; Transportation   Sleep  Sleep:Fair  Number of hours: No data recorded  Nutritional Assessment (For OBS and FBC admissions only) Has the patient had a weight loss or gain of 10 pounds or more in the last 3 months?: No Has the patient had a decrease in food intake/or appetite?: No Does the patient have dental problems?: No Does the patient have eating habits or behaviors that may be indicators of an eating disorder including binging or inducing vomiting?: No Has the patient recently lost weight without trying?: 0 Has the patient been eating poorly because of a decreased appetite?: 0 Malnutrition Screening Tool Score: 0    Physical Exam: Physical Exam Vitals and nursing note reviewed. Exam conducted with a chaperone present.  Constitutional:      General: He is not in acute distress.    Appearance: Normal appearance. He is not ill-appearing.  Cardiovascular:     Rate and Rhythm: Normal rate.  Pulmonary:     Effort: Pulmonary effort is normal.  Musculoskeletal:        General: Normal range of motion.     Cervical back: Normal range of motion.  Skin:    General: Skin is warm and dry.  Neurological:     Mental Status: He is alert and oriented to person, place, and time.  Psychiatric:        Attention and Perception: Attention and perception normal. He does not perceive auditory or visual hallucinations.        Mood and Affect: Affect normal. Mood is depressed.        Speech: Speech normal.        Behavior: Behavior normal. Behavior is cooperative.        Thought Content: Thought content is not paranoid or delusional. Thought content does not include homicidal ideation. Suicidal: Passive thoughts.       Cognition and Memory: Cognition and memory normal.         Judgment: Judgment normal.    Review of Systems  Constitutional: Negative.   HENT: Negative.    Eyes: Negative.   Respiratory: Negative.    Cardiovascular: Negative.   Gastrointestinal: Negative.   Genitourinary: Negative.   Musculoskeletal: Negative.   Skin: Negative.   Neurological: Negative.   Endo/Heme/Allergies: Negative.   Psychiatric/Behavioral:  Depression: adjusting to recent changes.  Stable. Hallucinations: Denies. Substance abuse: Alcohol but not daily, Denies illicit drug use. Suicidal ideas: Voiced passive thoughts with no intent or plan and no prior suicide attempt. Nervous/anxious: Stable. Insomnia: Fair.    There were no vitals taken for this visit. There is no height or weight on file to calculate BMI.  Musculoskeletal: Strength & Muscle Tone: within normal limits Gait & Station: normal Patient leans: N/A   BHUC MSE Discharge Disposition for Follow up and Recommendations: Based on my evaluation the patient does not appear to have an emergency medical condition and can be discharged with resources and follow up care in outpatient services for Medication Management, Substance Abuse Intensive Outpatient Program, Individual Therapy, and Group Therapy   Follow-up Information     Services, [facility].   Specialty: Behavioral Health Why: [facility] Triage Times (no appointment necessary).  (offers outpatient  therapy and intensive outpatient substance abuse therapy). Contact information: [address] [place] [address] [phone]         [facility].   Specialty: Behavioral Health Why: Call for an appointment Contact information: [address] [place] [address] [phone]                  Discharge Instructions      [facility] [address]  [phone] Website:  [email or web address] Closes 10?PM  [facility] DUI Assessment [address] [place], [place] [address] [phone]  Safety Plan Stephen PetitDesmond Sharp will reach out to his mother or aunt, call 911 or call mobile crisis, or go to nearest emergency room if condition worsens or if suicidal thoughts become active Patients' will follow up with resources for outpatient psychiatric services (therapy/medication management).  The suicide prevention education provided includes the following: Suicide risk factors Suicide prevention and interventions National Suicide Hotline telephone number [facility] assessment telephone number [facility] Emergency Assistance 911 [facility] and/or Residential Mobile Crisis Unit telephone number Request went over with patient and to share with family Remove weapons (e.g., guns, rifles, knives), all items previously/currently identified as safety concern.   Remove drugs/medications (over the counter, prescriptions, illicit drugs), all items previously/currently identified as a safety concern.    For Outpatient psychiatric Services    Walk in hours for medication management Monday, Wednesday, Thursday, and Friday from 8:00 AM to 11:00 AM Recommend arriving by by 7:30 AM.  It is first come first serve.    Walk in hours for therapy intake Monday and Wednesday only 8:00 AM to 11:00 AM Encouraged to arrive by 7:30 AM.  It is first come first serve     Substance Abuse Treatment Programs  Intensive Outpatient Programs [facility]     [address]      [place], [place]                   [phone]       [facility] [address] [place], [place] [phone]  [facility]     (Inpatient and outpatient)     [address]           [phone]    [facility] [phone] (Suboxone and Methadone)  [address]      [place], [place] [address]      [phone]       [address] [place], [place] [phone]  [facility] (Outpatient/Inpatient, [employer])    (insurance only) [phone]             [facility] (Groups & Residential) [place], [place] [phone]     [facility]     [address]     [place], [place]      [phone]       [facility] (for caregivers and family) [phone] [address] [place], [place] [phone]      Residential Treatment Programs [facility]      [address], [place], [place] [address]  [phone]       [facility] [address] [place], [place] [address] [phone]  [facility]        [phone]       [facility] [phone]  [facility] ([facility]  [address]                                         [place], [place]                                                [phone] or [phone]                               [facility] [place] [address] [place], [address] [phone]  [facility]    [address]      [place], [place]     [phone]       [facility] [address] [place], [place] [phone]  [facility]   [address] [place], [place] [address]     [phone]      Admissions: 8am-3pm M-F  [facility] Services (RTS) [address] [place], [place] [phone]  [facility] Program: Residential Program [phone] Days)   [place], [place]      [phone] or [phone]     [facility]: [facility] [place], [place] (Walk in Hours over the weekend or by referral)  [facility] [address] [place], [place], [place] [address] [phone]  Crisis Mobile: [facility]:  [phone] (for crisis response 24 hours a day) [facility] Hotline:      (408)566-6701 Outpatient Psychiatry and Counseling  [facility]: Mobile Crisis Management 24 hours:   [phone]  [facility] of [facility] [employer] sliding scale fee and walk in schedule: M-F 8am-12pm/1pm-3pm [address]  [place], [place] [address] [phone]  [facility] [address] [place], [place] [address] [phone]  [facility] (Formerly known as [facility] [employer])- new patient walk-in appointments available Monday - Friday 8am -3pm.          [address] [place], [place] [address] [phone] or crisis line- [phone]  [facility] Outpatient Services/ Intensive Outpatient Therapy Program [address] [place], [place] [address] [phone]  [facility]                  Crisis Services      [phone] [address]     [place], [place] [address]                 [facility]   [facility] [phone]. [address] [place], [place] [address]   [facility] of Care          [address]  [place], [place] [address]       [phone]  [facility] [address] [place], [place] [address] [phone]  [facility]    [address]    [place], [place] [address]     [phone]       Andee Poles, MD     [address]     [place] [address]     [phone]       [facility] [address] [place] [address]  [facility]     [address] [place], [place]      [phone]       [facility] Eulogio Ditch, MD [address] [place], [place] [address] [phone]  [facility]     [address]     [place], [place] [address]     [phone]       [facility] [address] [place], [place] [address] [phone] Resources for Temporary Residential Assistance/Crisis Centers  [facility]) M-F 8am-3pm   [address] [place], [place] [address]   [phone] Services include: laundry, barbering, support groups, case management, phone  & computer access, showers, AA/NA mtgs, mental health/substance abuse nurse, job skills class, disability information, VA assistance, spiritual classes, etc.   HOMELESS SHELTERS  [facility]     [facility]   [address], [place]     [phone]              [employer] (women and children)       [address] [place], [place] [address] [phone] [email or web address] for application and process Application Required  [facility]   [address]    [place] [address]     [phone]                    [facility] [place] [address] [place], [place] [address] [phone] application appt.) Application Required  [facility] (women only)    [address]     [place], [place] [address]     [phone]      Intake starts 6pm daily Need valid ID, SSC, & Police report [employer] [address] [place], [place] [phone] Application Required  [employer] (men only)     [address]      [place], [place]     [phone]       [facility] [place] (Pregnant women only) [address] [place], [place] [phone]  [facility]      [address]      [place], [place] [address]     [phone]             [facility] [address] [place], [place] [phone] 90 day commitment/SA/Application process  [facility](men only)     [address]     [place], [place]     [phone]       Check-in at [facility] [address] [place], [place] [address] [phone] Men/Women/Women and Children must be there by 7 pm  [facility] [place], [place] [phone]                   Suicide  Resources  Who to Call Call 911 National Suicide Prevention Hotline - Dial 988   [facility] at (734) 536-0241; [phone]  More Resources Suicide Awareness Voices of Education       [phone]        www.save.org [employer] on Mental Illness(NAMI)       (800) 950-NAMI        www.nami.org American Association of Suicidology       [phone]  [phone]        www.suicidology.org      Kaedon Fanelli, NP 10/26/2021, 4:30 PM

## 2021-10-26 NOTE — Discharge Instructions (Addendum)
[facility] DUI School [address]  [phone] Website:  [email or web address] Closes 10?PM  [facility] Counseling DUI Assessment [address] [place], [place] [address] [phone]  Safety Plan Stephen Sharp will reach out to his mother or aunt, call 911 or call mobile crisis, or go to nearest emergency room if condition worsens or if suicidal thoughts become active Patients' will follow up with resources for outpatient psychiatric services (therapy/medication management).  The suicide prevention education provided includes the following: Suicide risk factors Suicide prevention and interventions National Suicide Hotline telephone number [facility] assessment telephone number [facility] Emergency Assistance 911 [facility] and/or Residential Mobile Crisis Unit telephone number Request went over with patient and to share with family Remove weapons (e.g., guns, rifles, knives), all items previously/currently identified as safety concern.   Remove drugs/medications (over the counter, prescriptions, illicit drugs), all items previously/currently identified as a safety concern.    For Outpatient psychiatric Services    Walk in hours for medication management Monday, Wednesday, Thursday, and Friday from 8:00 AM to 11:00 AM Recommend arriving by by 7:30 AM.  It is first come first serve.    Walk in hours for therapy intake Monday and Wednesday only 8:00 AM to 11:00 AM Encouraged to arrive by 7:30 AM.  It is first come first serve     Substance Abuse Treatment Programs  Intensive Outpatient Programs [facility]     [address]      [place], [place]                   [phone]       [facility] [address] [place] [address] [place], [place] [phone]  [facility]     (Inpatient and outpatient)     [address]  Dr[address]           [phone]    [facility] [phone] (Suboxone and Methadone)  [address]      [place], [place] [address]      [phone]       [address] [place], [place] [phone]  [facility] (Outpatient/Inpatient, Chemical)    (insurance only) [phone]             [facility] (Groups & Residential) [place], [place] [phone]     [facility]     [address]     [place], [place]      [phone]       [facility] (for caregivers and family) [phone] [address] Laurell [address] [place], [place] [phone]      Residential Treatment Programs [facility]      [address] [place], [place] [address]  [phone]       [facility] [address] [place], [place] [address] [phone]  [facility] [place]        [phone]       [facility] [phone]  [facility] ([facility]             [address]                                         [place], [place]  [phone] or [phone]                               [facility] [address] [place], [address] [phone]  [facility]    [address]      [place], [place]     [phone]       [facility] [address] [place], [place] [phone]  [facility]   [address] [place], [place] [address]     [phone]      Admissions: 8am-3pm M-F  [facility] ([facility]) [address] [place], [place] [phone]  [facility] Program: Residential Program [phone] Days)   [place], [place]      [phone] or [phone]     [facility]: [facility] [place], [place] (Walk in Hours over the weekend or by referral)  [facility] [address] [place], [place], [place] [address] [phone]  Crisis Mobile: Therapeutic  Alternatives:  [phone] (for crisis response 24 hours a day) [facility] Hotline:      941-175-0960 Outpatient Psychiatry and Counseling  Therapeutic Alternatives: Mobile Crisis Management 24 hours:  [phone]  [facility] [employer] sliding scale fee and walk in schedule: M-F 8am-12pm/1pm-3pm [address]  [place], [place] [address] [phone]  [facility] [address] [place], [place] [address] [phone]  [facility] ([facility] [employer])- new patient walk-in appointments available Monday - Friday 8am -3pm.          [address] [place], [place] [address] [phone] or crisis line- [phone]  [facility] Outpatient Services/ Intensive Outpatient Therapy Program [address] [place], [place] [address] [phone]  [facility]                  Crisis Services      [phone]     [place], [place] [address]                 [facility]   [facility] [phone]. [address] [place], [place] [address]   [facility]          [address] Laura  [place], [place] [address]       [phone]  [facility] [address] [place], [place] [address] [phone]  [facility]    [address]    [place], [place] [address]     [phone]       Andee Poles, MD     [address]     [place] [address]     [phone]       [facility] [address] [place] [address]  [facility]     [address] [place], [place]      [phone]       [facility] Eulogio Ditch, MD [address] [place], [place] [address] [phone]  [facility]     [address]     [place], [place]  [address]     [phone]       [facility] [address] [place], [place] [address] [phone] Resources for  Temporary Residential Assistance/Crisis Theme park manager [facility]) M-F 8am-3pm   [address] [place], [place] [address]   [phone] Services include: laundry, barbering, support groups, case management, phone  & computer access, showers, AA/NA mtgs, mental health/substance abuse nurse, job skills class, disability information, VA assistance, spiritual classes, etc.   HOMELESS SHELTERS  [facility]     [facility]   [address] [place]     [phone]              [employer] (women and children)       [address] [place], [place] [address] [phone] [email or web address] for application and process Application Required  [facility] [employer] [facility]   [address]    [place] [address]     [phone]                    [facility] [place] [address] [place], [place] [address] [phone] application appt.) Application Required  [facility] (women only)    [address]     [place], [place] [address]     [phone]      Intake starts 6pm daily Need valid ID, SSC, & Police report [employer] [address] [place], [place] [phone] Application Required  [employer] (men only)     [address]      [place], [place]     [phone]       [facility] (Pregnant women only) [address] [place], [place] [phone]  [facility]      [address]      [place], [place] [address]     [phone]             [facility] [address] [place], [place] [phone] 90 day commitment/SA/Application process  [facility](men only)     [address]     [place],  [place]     [phone]       Check-in at [facility] [address] [place], [place] [address] [phone] Men/Women/Women and Children must be there by 7 pm  [facility] [place], [place] [phone]                   Suicide Resources  Who to Call Call 911 National Suicide Prevention Hotline - Dial 988   [facility] at 938-445-7936; [phone]  More Resources Suicide Awareness Voices of Education       [phone]        www.save.org [employer] on Mental Illness(NAMI)       (800) 950-NAMI        www.nami.org American Association of Suicidology       [phone]        [email or web address]
